# Patient Record
Sex: Female | Born: 1980 | Race: White | Hispanic: No | Marital: Married | Smoking: Never smoker
Health system: Southern US, Community
[De-identification: ages and names within clinical notes are randomized; demographics above are authoritative.]

## PROBLEM LIST (undated history)

## (undated) DIAGNOSIS — I10 Essential (primary) hypertension: Secondary | ICD-10-CM

## (undated) HISTORY — PX: HERNIA REPAIR: SHX51

## (undated) HISTORY — PX: TUBAL LIGATION: SHX77

## (undated) HISTORY — DX: Essential (primary) hypertension: I10

## (undated) HISTORY — PX: CHOLECYSTECTOMY: SHX55

---

## 2016-07-22 ENCOUNTER — Encounter: Payer: Self-pay | Admitting: Emergency Medicine

## 2016-07-22 ENCOUNTER — Emergency Department: Payer: Self-pay

## 2016-07-22 DIAGNOSIS — I8002 Phlebitis and thrombophlebitis of superficial vessels of left lower extremity: Secondary | ICD-10-CM | POA: Insufficient documentation

## 2016-07-22 DIAGNOSIS — I83892 Varicose veins of left lower extremities with other complications: Secondary | ICD-10-CM | POA: Insufficient documentation

## 2016-07-22 NOTE — ED Triage Notes (Signed)
Patient ambulatory to triage with steady gait, without difficulty or distress noted; pt reports bruising to upper right LE; denies any known injury; st swelling & pain

## 2016-07-23 ENCOUNTER — Emergency Department
Admission: EM | Admit: 2016-07-23 | Discharge: 2016-07-23 | Disposition: A | Discharge status: Home / Self Care | Payer: Self-pay | Attending: Emergency Medicine | Admitting: Emergency Medicine

## 2016-07-23 DIAGNOSIS — I8002 Phlebitis and thrombophlebitis of superficial vessels of left lower extremity: Secondary | ICD-10-CM

## 2016-07-23 DIAGNOSIS — I8392 Asymptomatic varicose veins of left lower extremity: Secondary | ICD-10-CM

## 2016-07-23 MED ORDER — OXYCODONE-ACETAMINOPHEN 5-325 MG PO TABS: 1.0000 | ORAL_TABLET | ORAL | 0 refills | Status: DC | PRN

## 2016-07-23 MED ORDER — OXYCODONE-ACETAMINOPHEN 5-325 MG PO TABS
1.0000 | ORAL_TABLET | Freq: Once | ORAL | Status: AC
  Administered 2016-07-23: 1 via ORAL
  Filled 2016-07-23: qty 1

## 2016-07-23 MED ORDER — IBUPROFEN 800 MG PO TABS: 800.0000 mg | ORAL_TABLET | Freq: Three times a day (TID) | ORAL | 0 refills | Status: AC | PRN

## 2016-07-23 MED ORDER — IBUPROFEN 800 MG PO TABS
800.0000 mg | ORAL_TABLET | Freq: Once | ORAL | Status: AC
  Administered 2016-07-23: 800 mg via ORAL
  Filled 2016-07-23: qty 1

## 2016-07-23 NOTE — ED Provider Notes (Signed)
[facility] Emergency Department Provider Note   ____________________________________________   First MD Initiated Contact with Patient 07/23/16 0038     (approximate)  I have reviewed the triage vital signs and the nursing notes.   HISTORY  Chief Complaint Leg Pain    HPI Heidi Morrow is a 36 y.o. female who presents to the ED from home with a chief complaint of left leg pain. Patient moved to town 2 weeks ago, 5 hour car ride. Developed redness and swelling to her left lower leg 4-5 days ago. 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 Denies associated fever, chills, chest pain, shortness of breath, abdominal pain, nausea, vomiting, diarrhea. Denies recent trauma. Nothing makes her symptoms better or worse.   Past medical history None  There are no active problems to display for this patient.   Past Surgical History:  Procedure Laterality Date  . CHOLECYSTECTOMY    . HERNIA REPAIR    . TUBAL LIGATION      Prior to Admission medications   Medication Sig Start Date End Date Taking? Authorizing Provider  lisinopril (PRINIVIL,ZESTRIL) 20 MG tablet Take 20 mg by mouth daily.   Yes Historical Provider, MD  ibuprofen (ADVIL,MOTRIN) 800 MG tablet Take 1 tablet (800 mg total) by mouth every 8 (eight) hours as needed for moderate pain. 07/23/16   Irean Hong, MD  oxyCODONE-acetaminophen (ROXICET) 5-325 MG tablet Take 1 tablet by mouth every 4 (four) hours as needed for severe pain. 07/23/16   Irean Hong, MD    Allergies Patient has no known allergies.  No family history on file.  Social History Social History  Substance Use Topics  . Smoking status: Never Smoker  . Smokeless tobacco: Never Used  . Alcohol use No    Review of Systems  Constitutional: No fever/chills. Eyes: No visual changes. ENT: No sore throat. Cardiovascular: Denies chest pain. Respiratory: Denies shortness of breath. Gastrointestinal: No  abdominal pain.  No nausea, no vomiting.  No diarrhea.  No constipation. Genitourinary: Negative for dysuria. Musculoskeletal: Positive for left leg pain. Negative for back pain. Skin: Negative for rash. Neurological: Negative for headaches, focal weakness or numbness.  10-point ROS otherwise negative.  ____________________________________________   PHYSICAL EXAM:  VITAL SIGNS: ED Triage Vitals  Enc Vitals Group     BP 07/22/16 2233 (!) 150/68     Pulse Rate 07/22/16 2233 97     Resp 07/22/16 2233 18     Temp 07/22/16 2233 97.9 F (36.6 C)     Temp Source 07/22/16 2233 Oral     SpO2 07/22/16 2233 99 %     Weight 07/22/16 2232 270 lb (122.5 kg)     Height 07/22/16 2232 5\' 7"  (1.702 m)     Head Circumference --      Peak Flow --      Pain Score 07/22/16 2232 3     Pain Loc --      Pain Edu? --      Excl. in GC? --     Constitutional: Alert and oriented. Well appearing and in no acute distress. Eyes: Conjunctivae are normal. PERRL. EOMI. Head: Atraumatic. Nose: No congestion/rhinnorhea. Mouth/Throat: Mucous membranes are moist.  Oropharynx non-erythematous. Neck: No stridor.   Cardiovascular: Normal rate, regular rhythm. Grossly normal heart sounds.  Good peripheral circulation. Respiratory: Normal respiratory effort.  No retractions. Lungs CTAB. Gastrointestinal: Soft and nontender. No distention. No abdominal bruits. No CVA tenderness.  Musculoskeletal:  LLE: Large, palm-sized area of erythema, warmth and swelling to anterior leg and medial calf. Calf is supple without evidence of compartment syndrome. Palpable distal pulses. Brisk, less than 5 second capillary refill. Symmetrically warm and without evidence for ischemia. Neurologic:  Normal speech and language. No gross focal neurologic deficits are appreciated.  Skin:  Skin is warm, dry and intact. No rash noted. Psychiatric: Mood and affect are normal. Speech and behavior are  normal.  ____________________________________________   LABS (all labs ordered are listed, but only abnormal results are displayed)  Labs Reviewed - No data to display ____________________________________________  EKG  None ____________________________________________  RADIOLOGY  Left leg venous ultrasound interpreted per Dr. Phill MyronMcClintock: 1. No evidence of deep venous thrombosis.  2. Multiple prominent thrombosed varicose veins at the medial and  lateral aspect of the calf at area of pain, swelling, and redness.   ____________________________________________   PROCEDURES  Procedure(s) performed: None  Procedures  Critical Care performed: No  ____________________________________________   INITIAL IMPRESSION / ASSESSMENT AND PLAN / ED COURSE  Pertinent labs & imaging results that were available during my care of the patient were reviewed by me and considered in my medical decision making (see chart for details).  36 year old female who presents with left leg pain, redness and swelling secondary to thrombosed varicose veins. Will treat with NSAID, analgesia and referred to vascular surgery for follow-up. Strict return precautions given. Patient verbalizes understanding and agrees with plan of care.      ____________________________________________   FINAL CLINICAL IMPRESSION(S) / ED DIAGNOSES  Final diagnoses:  Varicose veins of left lower extremity  Thrombophlebitis of superficial veins of left lower extremity      NEW MEDICATIONS STARTED DURING THIS VISIT:  New Prescriptions   IBUPROFEN (ADVIL,MOTRIN) 800 MG TABLET    Take 1 tablet (800 mg total) by mouth every 8 (eight) hours as needed for moderate pain.   OXYCODONE-ACETAMINOPHEN (ROXICET) 5-325 MG TABLET    Take 1 tablet by mouth every 4 (four) hours as needed for severe pain.     Note:  This document was prepared using Dragon voice recognition software and may include unintentional dictation  errors.    Irean HongJade J Shelitha Magley, MD 07/23/16 [phone]

## 2016-07-23 NOTE — Discharge Instructions (Signed)
1. Take pain medicines as needed (Motrin/Percocet #15). 2. Apply warm compresses several times daily. 3. Return to the ER for worsening symptoms, persistent vomiting, chest pain, difficulty breathing or other concerns.

## 2016-08-28 ENCOUNTER — Ambulatory Visit (INDEPENDENT_AMBULATORY_CARE_PROVIDER_SITE_OTHER): Payer: BLUE CROSS/BLUE SHIELD | Admitting: Vascular Surgery

## 2016-08-28 ENCOUNTER — Encounter (INDEPENDENT_AMBULATORY_CARE_PROVIDER_SITE_OTHER): Payer: Self-pay | Admitting: Vascular Surgery

## 2016-08-28 VITALS — BP 144/103 | HR 79 | Resp 16 | Ht 67.0 in | Wt 272.0 lb

## 2016-08-28 DIAGNOSIS — I8002 Phlebitis and thrombophlebitis of superficial vessels of left lower extremity: Secondary | ICD-10-CM | POA: Diagnosis not present

## 2016-08-28 DIAGNOSIS — R600 Localized edema: Secondary | ICD-10-CM

## 2016-08-28 DIAGNOSIS — I809 Phlebitis and thrombophlebitis of unspecified site: Secondary | ICD-10-CM | POA: Insufficient documentation

## 2016-08-28 NOTE — Progress Notes (Signed)
Subjective:    Patient ID: Heidi Morrow, female    DOB: 1980/06/22, 36 y.o.   MRN: [id number] Chief Complaint  Patient presents with  . New Patient (Initial Visit)   Patient presents after being seen and diagnosed with left lower extremity superficial thrombophlebitis (in [facility] ED) on 07/23/16. Patient recently moved from [place] and has been doing a lot of driving lately. She was placed on ASA and encouraged to wear compression stockings and elevate her legs. Her symptoms have improved. Since then, she has been wearing medical grade one compression, elevating her legs and remaining active. She does have a history of bilateral lower extremity swelling. Her job requires her to stand for long hours. She reports her swelling is worse at the end of the day. She denies a history of cellulitis. She does not have history of DVT however her father is prone to them. She denies any fever, nausea or vomitPatient presents after being seen and diagnosed with left lower extremity superficial thrombophlebitis (in [facility] ED) on 07/23/16. Patient ing.    Review of Systems  Constitutional: Negative.   HENT: Negative.   Eyes: Negative.   Respiratory: Negative.   Cardiovascular: Positive for leg swelling.  Gastrointestinal: Negative.   Endocrine: Negative.   Genitourinary: Negative.   Musculoskeletal: Negative.   Skin: Negative.   Allergic/Immunologic: Negative.   Neurological: Negative.   Hematological: Negative.   Psychiatric/Behavioral: Negative.        Objective:   Physical Exam  Constitutional: She is oriented to person, place, and time. She appears well-developed and well-nourished. No distress.  HENT:  Head: Normocephalic and atraumatic.  Eyes: Conjunctivae are normal. Pupils are equal, round, and reactive to light.  Neck: Normal range of motion.  Cardiovascular: Normal rate, regular rhythm, normal heart sounds and intact distal pulses.   Pulses:      Radial pulses are 2+ on the  right side, and 2+ on the left side.       Dorsalis pedis pulses are 2+ on the right side, and 2+ on the left side.       Posterior tibial pulses are 2+ on the right side, and 2+ on the left side.  Pulmonary/Chest: Effort normal.  Musculoskeletal: Normal range of motion. She exhibits edema (Moderate Bilateral Edema).  Neurological: She is alert and oriented to person, place, and time.  Skin: Skin is warm and dry. She is not diaphoretic.  Psychiatric: She has a normal mood and affect. Her behavior is normal. Judgment and thought content normal.  Vitals reviewed.  BP (!) 144/103   Pulse 79   Resp 16   Ht 5\' 7"  (1.702 m)   Wt 272 lb (123.4 kg)   BMI 42.60 kg/m   Past Medical History:  Diagnosis Date  . Hypertension    Social History   Social History  . Marital status: Married    Spouse name: N/A  . Number of children: N/A  . Years of education: N/A   Occupational History  . Not on file.   Social History Main Topics  . Smoking status: Never Smoker  . Smokeless tobacco: Never Used  . Alcohol use No  . Drug use: No  . Sexual activity: Not on file   Other Topics Concern  . Not on file   Social History Narrative  . No narrative on file   Past Surgical History:  Procedure Laterality Date  . CHOLECYSTECTOMY    . HERNIA REPAIR    . TUBAL LIGATION  Family History  Problem Relation Age of Onset  . Ovarian cancer Mother   . Hypertension Mother   . Heart disease Father   . Deep vein thrombosis Father   . Cancer Maternal Grandmother   . Diabetes Maternal Grandfather   . Heart attack Maternal Grandfather   . Hyperlipidemia Paternal Grandmother   . Deep vein thrombosis Paternal Grandmother   . Hypertension Paternal Grandmother   . Varicose Veins Paternal Grandmother   . Hyperlipidemia Paternal Grandfather   . Hypertension Paternal Grandfather    No Known Allergies     Assessment & Plan:  Patient presents after being seen and diagnosed with left lower extremity  superficial thrombophlebitis (in [facility] ED) on 07/23/16. Patient recently moved from [place] and has been doing a lot of driving lately. She was placed on ASA and encouraged to wear compression stockings and elevate her legs. Her symptoms have improved. Since then, she has been wearing medical grade one compression, elevating her legs and remaining active. She does have a history of bilateral lower extremity swelling. Her job requires her to stand for long hours. She reports her swelling is worse at the end of the day. She denies a history of cellulitis. She does not have history of DVT however her father is prone to them. She denies any fever, nausea or vomiting.   1. Bilateral lower extremity edema - New Patient with episode on left lower extremity superificial thrombophlebitis - this is resolving Patient with persistent swelling of lower extremity. Will bring patient back for venous duplex to rule out reflux. Continue conservative therapy for now.  - VAS US LOWER EXTREMITY VENOUS REFLUX; Future  2. Thrombophlebitis of superficial veins of left lower extremity - Resolving Patient with resolving thrombophlebitis of left lower extremity As above.  Current Outpatient Prescriptions on File Prior to Visit  Medication Sig Dispense Refill  . lisinopril (PRINIVIL,ZESTRIL) 20 MG tablet Take 20 mg by mouth daily.    Marland Kitchen. ibuprofen (ADVIL,MOTRIN) 800 MG tablet Take 1 tablet (800 mg total) by mouth every 8 (eight) hours as needed for moderate pain. (Patient not taking: Reported on 08/28/2016) 15 tablet 0  . oxyCODONE-acetaminophen (ROXICET) 5-325 MG tablet Take 1 tablet by mouth every 4 (four) hours as needed for severe pain. (Patient not taking: Reported on 08/28/2016) 15 tablet 0   No current facility-administered medications on file prior to visit.     There are no Patient Instructions on file for this visit. No Follow-up on file.   KIMBERLY A STEGMAYER, PA-C

## 2016-11-09 ENCOUNTER — Ambulatory Visit (INDEPENDENT_AMBULATORY_CARE_PROVIDER_SITE_OTHER): Payer: BLUE CROSS/BLUE SHIELD | Admitting: Vascular Surgery

## 2016-11-09 ENCOUNTER — Encounter (INDEPENDENT_AMBULATORY_CARE_PROVIDER_SITE_OTHER): Payer: BLUE CROSS/BLUE SHIELD

## 2017-03-01 ENCOUNTER — Encounter: Payer: Self-pay | Admitting: Emergency Medicine

## 2017-03-01 ENCOUNTER — Emergency Department
Admission: EM | Admit: 2017-03-01 | Discharge: 2017-03-01 | Disposition: A | Discharge status: Home / Self Care | Payer: BLUE CROSS/BLUE SHIELD | Attending: Emergency Medicine | Admitting: Emergency Medicine

## 2017-03-01 ENCOUNTER — Other Ambulatory Visit: Payer: Self-pay

## 2017-03-01 DIAGNOSIS — Y990 Civilian activity done for income or pay: Secondary | ICD-10-CM | POA: Insufficient documentation

## 2017-03-01 DIAGNOSIS — I10 Essential (primary) hypertension: Secondary | ICD-10-CM | POA: Insufficient documentation

## 2017-03-01 DIAGNOSIS — Y93G1 Activity, food preparation and clean up: Secondary | ICD-10-CM | POA: Diagnosis not present

## 2017-03-01 DIAGNOSIS — S61011A Laceration without foreign body of right thumb without damage to nail, initial encounter: Secondary | ICD-10-CM | POA: Insufficient documentation

## 2017-03-01 DIAGNOSIS — Z79899 Other long term (current) drug therapy: Secondary | ICD-10-CM | POA: Insufficient documentation

## 2017-03-01 DIAGNOSIS — W260XXA Contact with knife, initial encounter: Secondary | ICD-10-CM | POA: Diagnosis not present

## 2017-03-01 DIAGNOSIS — Y929 Unspecified place or not applicable: Secondary | ICD-10-CM | POA: Insufficient documentation

## 2017-03-01 MED ORDER — OXYCODONE-ACETAMINOPHEN 5-325 MG PO TABS
1.0000 | ORAL_TABLET | Freq: Once | ORAL | Status: AC
  Administered 2017-03-01: 1 via ORAL
  Filled 2017-03-01: qty 1

## 2017-03-01 MED ORDER — CEPHALEXIN 500 MG PO CAPS: 500.0000 mg | ORAL_CAPSULE | Freq: Four times a day (QID) | ORAL | 0 refills | Status: AC

## 2017-03-01 MED ORDER — TETANUS-DIPHTH-ACELL PERTUSSIS 5-2.5-18.5 LF-MCG/0.5 IM SUSP
0.5000 mL | Freq: Once | INTRAMUSCULAR | Status: AC
  Administered 2017-03-01: 0.5 mL via INTRAMUSCULAR
  Filled 2017-03-01: qty 0.5

## 2017-03-01 NOTE — ED Triage Notes (Signed)
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 Denies WC. Bleeding controlled with b andage.

## 2017-03-01 NOTE — ED Provider Notes (Signed)
[facility] Emergency Department Provider Note  ____________________________________________  Time seen: Approximately 1:33 PM  I have reviewed the triage vital signs and the nursing notes.   HISTORY  Chief Complaint Extremity Laceration    HPI Heidi Morrow is a 36 y.o. female that presents to the emergency department for evaluation of right hand laceration.  Patient was working today when she cut herself with a mandolin. She is not having any difficulty moving thumb.  Pain is throbbing in character.  She is unsure of last tetanus shot.  No numbness, tingling.  Past Medical History:  Diagnosis Date  . Hypertension     Patient Active Problem List   Diagnosis Date Noted  . Superficial thrombophlebitis 08/28/2016  . Bilateral lower extremity edema 08/28/2016    Past Surgical History:  Procedure Laterality Date  . CHOLECYSTECTOMY    . HERNIA REPAIR    . TUBAL LIGATION      Prior to Admission medications   Medication Sig Start Date End Date Taking? Authorizing Provider  cephALEXin (KEFLEX) 500 MG capsule Take 1 capsule (500 mg total) 4 (four) times daily for 10 days by mouth. 03/01/17 03/11/17  Enid Derry, PA-C  ibuprofen (ADVIL,MOTRIN) 800 MG tablet Take 1 tablet (800 mg total) by mouth every 8 (eight) hours as needed for moderate pain. Patient not taking: Reported on 08/28/2016 07/23/16   Irean Hong, MD  lisinopril (PRINIVIL,ZESTRIL) 20 MG tablet Take 20 mg by mouth daily.    [provider]  oxyCODONE-acetaminophen (ROXICET) 5-325 MG tablet Take 1 tablet by mouth every 4 (four) hours as needed for severe pain. Patient not taking: Reported on 08/28/2016 07/23/16   Irean Hong, MD    Allergies Patient has no known allergies.  Family History  Problem Relation Age of Onset  . Ovarian cancer Mother   . Hypertension Mother   . Heart disease Father   . Deep vein thrombosis Father   . Cancer Maternal Grandmother   . Diabetes  Maternal Grandfather   . Heart attack Maternal Grandfather   . Hyperlipidemia Paternal Grandmother   . Deep vein thrombosis Paternal Grandmother   . Hypertension Paternal Grandmother   . Varicose Veins Paternal Grandmother   . Hyperlipidemia Paternal Grandfather   . Hypertension Paternal Grandfather     Social History Social History   Tobacco Use  . Smoking status: Never Smoker  . Smokeless tobacco: Never Used  Substance Use Topics  . Alcohol use: No  . Drug use: No     Review of Systems  Constitutional: No fever/chills Cardiovascular: No chest pain. Respiratory: No SOB. Gastrointestinal: No abdominal pain.  No nausea, no vomiting.  Musculoskeletal: Positive for hand pain. Skin: Negative for ecchymosis. Neurological: Negative for headaches, numbness or tingling   ____________________________________________   PHYSICAL EXAM:  VITAL SIGNS: ED Triage Vitals  Enc Vitals Group     BP 03/01/17 1233 (!) 140/104     Pulse Rate 03/01/17 1233 91     Resp 03/01/17 1233 18     Temp 03/01/17 1233 97.9 F (36.6 C)     Temp Source 03/01/17 1233 Oral     SpO2 03/01/17 1233 96 %     Weight 03/01/17 1233 260 lb (117.9 kg)     Height 03/01/17 1233 5\' 7"  (1.702 m)     Head Circumference --      Peak Flow --      Pain Score 03/01/17 1232 10     Pain Loc --  Pain Edu? --      Excl. in GC? --      Constitutional: Alert and oriented. Well appearing and in no acute distress. Eyes: Conjunctivae are normal. PERRL. EOMI. Head: Atraumatic. ENT:      Ears:      Nose: No congestion/rhinnorhea.      Mouth/Throat: Mucous membranes are moist.  Neck: No stridor.   Cardiovascular: Normal rate, regular rhythm.  Good peripheral circulation.  Symmetric radial pulses bilaterally. Respiratory: Normal respiratory effort without tachypnea or retractions. Lungs CTAB. Good air entry to the bases with no decreased or absent breath sounds. Musculoskeletal: Full range of motion to all  extremities. No gross deformities appreciated. Neurologic:  Normal speech and language. No gross focal neurologic deficits are appreciated.  Skin:  Skin is warm, dry.  2 1 cm shallow lacerations to base of right thumb. Psychiatric: Mood and affect are normal. Speech and behavior are normal. Patient exhibits appropriate insight and judgement.   ____________________________________________   LABS (all labs ordered are listed, but only abnormal results are displayed)  Labs Reviewed - No data to display ____________________________________________  EKG   ____________________________________________  RADIOLOGY   No results found.  ____________________________________________    PROCEDURES  Procedure(s) performed:    Procedures  Lacerations were soaked in normal saline and iodine.  Dermabond and Steri-Strips were applied to lacerations.  Splint was placed.  Medications  Tdap (BOOSTRIX) injection 0.5 mL (0.5 mLs Intramuscular Given 03/01/17 1347)  oxyCODONE-acetaminophen (PERCOCET/ROXICET) 5-325 MG per tablet 1 tablet (1 tablet Oral Given 03/01/17 1346)     ____________________________________________   INITIAL IMPRESSION / ASSESSMENT AND PLAN / ED COURSE  Pertinent labs & imaging results that were available during my care of the patient were reviewed by me and considered in my medical decision making (see chart for details).  Review of [place] CSRS was performed in accordance of the [facility] prior to dispensing any controlled drugs.   Patient presented to the emergency department for evaluation of right hand lacerations.  Vital signs and exam are reassuring.  Lacerations are shallow and were repaired with Dermabond and Steri-Strips.  Thumb spica splint was placed so that patient does not reopen lacerations.  Tetanus shot was updated.  Patient will be discharged home with prescriptions for Keflex.  Work note was provided.  Patient is to follow up with PCP as directed. Patient  is given ED precautions to return to the ED for any worsening or new symptoms.     ____________________________________________  FINAL CLINICAL IMPRESSION(S) / ED DIAGNOSES  Final diagnoses:  Laceration of right thumb without foreign body without damage to nail, initial encounter      NEW MEDICATIONS STARTED DURING THIS VISIT:  Started on Keflex.    This chart was dictated using voice recognition software/Dragon. Despite best efforts to proofread, errors can occur which can change the meaning. Any change was purely unintentional.    Enid DerryWagner, Alondra Sahni, PA-C 03/01/17 1552    Don PerkingVeronese, [place], MD 03/01/17 [phone]

## 2017-08-20 ENCOUNTER — Encounter: Payer: Self-pay | Admitting: *Deleted

## 2017-08-20 ENCOUNTER — Ambulatory Visit (INDEPENDENT_AMBULATORY_CARE_PROVIDER_SITE_OTHER): Payer: BLUE CROSS/BLUE SHIELD

## 2017-08-20 ENCOUNTER — Ambulatory Visit
Admission: EM | Admit: 2017-08-20 | Discharge: 2017-08-20 | Disposition: A | Discharge status: Home / Self Care | Payer: BLUE CROSS/BLUE SHIELD | Attending: Family Medicine | Admitting: Family Medicine

## 2017-08-20 DIAGNOSIS — R059 Cough, unspecified: Secondary | ICD-10-CM

## 2017-08-20 DIAGNOSIS — R05 Cough: Secondary | ICD-10-CM

## 2017-08-20 DIAGNOSIS — R062 Wheezing: Secondary | ICD-10-CM

## 2017-08-20 MED ORDER — HYDROCOD POLST-CPM POLST ER 10-8 MG/5ML PO SUER: 5.0000 mL | Freq: Two times a day (BID) | ORAL | 0 refills | Status: DC | PRN

## 2017-08-20 MED ORDER — PREDNISONE 50 MG PO TABS: ORAL_TABLET | ORAL | 0 refills | Status: DC

## 2017-08-20 MED ORDER — ALBUTEROL SULFATE HFA 108 (90 BASE) MCG/ACT IN AERS: 1.0000 | INHALATION_SPRAY | Freq: Four times a day (QID) | RESPIRATORY_TRACT | 0 refills | Status: AC | PRN

## 2017-08-20 NOTE — ED Triage Notes (Signed)
Pt was diagnose with walking PNA three weeks ago and was given antibiotics, cough syrup, and cough pills. Pt states she felt better for about three days after taking meds, but now she has a non productive cough that will not stop.

## 2017-08-20 NOTE — ED Provider Notes (Signed)
[facility]    CSN: [id number] Arrival date & time: 08/20/17  1248  History   Chief Complaint Chief Complaint  Patient presents with  . Cough   HPI  37 year old female presents with cough.  Patient states that she has had ongoing cough.  She was recently seen by her primary on 3/12.  She was placed on antibiotic therapy for sinusitis.  She was also given cough medication.  She states that she traveled to [place] and remained sick.  She was given another course of antibiotic, which she states was penicillin.  Patient states that she improved but still has persistent cough.  She states that she is experiencing cough and shortness of breath.  No fever.  Seems to be exacerbated by her workplace.  No relieving factors.  No recent fever or chills.  No other associated symptoms.  No other complaints.  Past Medical History:  Diagnosis Date  . Hypertension    Patient Active Problem List   Diagnosis Date Noted  . Superficial thrombophlebitis 08/28/2016  . Bilateral lower extremity edema 08/28/2016   Past Surgical History:  Procedure Laterality Date  . CHOLECYSTECTOMY    . HERNIA REPAIR    . TUBAL LIGATION     OB History   None    Home Medications    Prior to Admission medications   Medication Sig Start Date End Date Taking? Authorizing Provider  escitalopram (LEXAPRO) 10 MG tablet Take 10 mg by mouth daily.   Yes [provider]  lisinopril (PRINIVIL,ZESTRIL) 20 MG tablet Take 20 mg by mouth daily.   Yes [provider]  albuterol (PROVENTIL HFA;VENTOLIN HFA) 108 (90 Base) MCG/ACT inhaler Inhale 1-2 puffs into the lungs every 6 (six) hours as needed for wheezing or shortness of breath. 08/20/17   Tommie Sams, DO  chlorpheniramine-HYDROcodone (TUSSIONEX PENNKINETIC ER) 10-8 MG/5ML SUER Take 5 mLs by mouth every 12 (twelve) hours as needed. 08/20/17   Tommie Sams, DO  ibuprofen (ADVIL,MOTRIN) 800 MG tablet Take 1 tablet (800 mg total) by mouth every 8  (eight) hours as needed for moderate pain. Patient not taking: Reported on 08/28/2016 07/23/16   Irean Hong, MD  predniSONE (DELTASONE) 50 MG tablet 1 tablet daily x 5 days. 08/20/17   Tommie Sams, DO    Family History Family History  Problem Relation Age of Onset  . Ovarian cancer Mother   . Hypertension Mother   . Heart disease Father   . Deep vein thrombosis Father   . Cancer Maternal Grandmother   . Diabetes Maternal Grandfather   . Heart attack Maternal Grandfather   . Hyperlipidemia Paternal Grandmother   . Deep vein thrombosis Paternal Grandmother   . Hypertension Paternal Grandmother   . Varicose Veins Paternal Grandmother   . Hyperlipidemia Paternal Grandfather   . Hypertension Paternal Grandfather     Social History Social History   Tobacco Use  . Smoking status: Never Smoker  . Smokeless tobacco: Never Used  Substance Use Topics  . Alcohol use: No  . Drug use: No     Allergies   Patient has no known allergies.   Review of Systems Review of Systems  Constitutional: Negative.   Respiratory: Positive for cough and shortness of breath.     Physical Exam Triage Vital Signs ED Triage Vitals  Enc Vitals Group     BP 08/20/17 1306 (!) 152/84     Pulse Rate 08/20/17 1306 (!) 119     Resp 08/20/17  1306 16     Temp 08/20/17 1306 98.4 F (36.9 C)     Temp src --      SpO2 08/20/17 1306 94 %     Weight 08/20/17 1305 253 lb (114.8 kg)     Height 08/20/17 1305 5\' 7"  (1.702 m)     Head Circumference --      Peak Flow --      Pain Score 08/20/17 1402 0     Pain Loc --      Pain Edu? --      Excl. in GC? --    Updated Vital Signs BP (!) 152/84   Pulse (!) 119   Temp 98.4 F (36.9 C)   Resp 16   Ht 5\' 7"  (1.702 m)   Wt 253 lb (114.8 kg)   LMP 08/12/2017   SpO2 94%   BMI 39.63 kg/m      Physical Exam  Constitutional: She is oriented to person, place, and time. She appears well-developed. No distress.  HENT:  Head: Normocephalic and atraumatic.    Mouth/Throat: Oropharynx is clear and moist.  Cardiovascular: Regular rhythm.  Tachycardia.  Pulmonary/Chest: Effort normal. No respiratory distress. She has wheezes.  Neurological: She is alert and oriented to person, place, and time.  Psychiatric: She has a normal mood and affect. Her behavior is normal.  Nursing note and vitals reviewed.  UC Treatments / Results  Labs (all labs ordered are listed, but only abnormal results are displayed) Labs Reviewed - No data to display  EKG None Radiology Dg Chest 2 View  Result Date: 08/20/2017 CLINICAL DATA:  Dry painful cough and shortness of breath for 6 weeks EXAM: CHEST - 2 VIEW COMPARISON:  None FINDINGS: Normal heart size, mediastinal contours, and pulmonary vascularity. Lungs clear. No pleural effusion or pneumothorax. Bones unremarkable. IMPRESSION: No acute abnormalities. Electronically Signed   By: Ulyses SouthwardMark  Boles M.D.   On: 08/20/2017 13:52    Procedures Procedures (including critical care time)  Medications Ordered in UC Medications - No data to display   Initial Impression / Assessment and Plan / UC Course  I have reviewed the triage vital signs and the nursing notes.  Pertinent labs & imaging results that were available during my care of the patient were reviewed by me and considered in my medical decision making (see chart for details).    37 year old female presents with cough and wheezing.  Xray negative. She has recently had antibiotic therapy.  No indication for additional antibiotics.  Treating with prednisone, albuterol, tussionex.  Final Clinical Impressions(s) / UC Diagnoses   Final diagnoses:  Cough  Wheezing    ED Discharge Orders        Ordered    predniSONE (DELTASONE) 50 MG tablet     08/20/17 1400    chlorpheniramine-HYDROcodone (TUSSIONEX PENNKINETIC ER) 10-8 MG/5ML SUER  Every 12 hours PRN     08/20/17 1400    albuterol (PROVENTIL HFA;VENTOLIN HFA) 108 (90 Base) MCG/ACT inhaler  Every 6 hours PRN      08/20/17 1403     Controlled Substance Prescriptions [place] Controlled Substance Registry consulted? Yes, I have consulted the [place] Controlled Substances Registry for this patient.  She recently had cough medication but no other narcotics.  Reasonable to proceed with cough medication.   Tommie SamsCook, Xiomar Crompton G, [place] 08/20/17 1423

## 2017-08-20 NOTE — Discharge Instructions (Signed)
Medications as prescribed. ° °Take care ° °Dr. Ezekiel Menzer  °

## 2017-09-13 ENCOUNTER — Other Ambulatory Visit: Payer: Self-pay | Admitting: Family Medicine

## 2018-02-28 ENCOUNTER — Other Ambulatory Visit: Payer: Self-pay

## 2018-02-28 ENCOUNTER — Encounter: Payer: Self-pay | Admitting: Emergency Medicine

## 2018-02-28 ENCOUNTER — Emergency Department
Admission: EM | Admit: 2018-02-28 | Discharge: 2018-02-28 | Disposition: A | Discharge status: Home / Self Care | Payer: BLUE CROSS/BLUE SHIELD | Attending: Emergency Medicine | Admitting: Emergency Medicine

## 2018-02-28 ENCOUNTER — Emergency Department: Payer: BLUE CROSS/BLUE SHIELD

## 2018-02-28 DIAGNOSIS — Y939 Activity, unspecified: Secondary | ICD-10-CM | POA: Diagnosis not present

## 2018-02-28 DIAGNOSIS — Y92410 Unspecified street and highway as the place of occurrence of the external cause: Secondary | ICD-10-CM | POA: Insufficient documentation

## 2018-02-28 DIAGNOSIS — Z79899 Other long term (current) drug therapy: Secondary | ICD-10-CM | POA: Diagnosis not present

## 2018-02-28 DIAGNOSIS — Y998 Other external cause status: Secondary | ICD-10-CM | POA: Diagnosis not present

## 2018-02-28 DIAGNOSIS — I10 Essential (primary) hypertension: Secondary | ICD-10-CM | POA: Insufficient documentation

## 2018-02-28 DIAGNOSIS — M79605 Pain in left leg: Secondary | ICD-10-CM | POA: Insufficient documentation

## 2018-02-28 MED ORDER — OXYCODONE-ACETAMINOPHEN 5-325 MG PO TABS
1.0000 | ORAL_TABLET | Freq: Once | ORAL | Status: AC
  Administered 2018-02-28: 1 via ORAL
  Filled 2018-02-28: qty 1

## 2018-02-28 MED ORDER — NAPROXEN 375 MG PO TABS: 375.0000 mg | ORAL_TABLET | Freq: Two times a day (BID) | ORAL | 0 refills | Status: DC

## 2018-02-28 MED ORDER — CYCLOBENZAPRINE HCL 5 MG PO TABS: ORAL_TABLET | ORAL | 0 refills | Status: DC
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
## 2018-04-28 ENCOUNTER — Ambulatory Visit
Admission: EM | Admit: 2018-04-28 | Discharge: 2018-04-28 | Disposition: A | Discharge status: Home / Self Care | Payer: BLUE CROSS/BLUE SHIELD | Attending: Family Medicine | Admitting: Family Medicine

## 2018-04-28 ENCOUNTER — Ambulatory Visit (INDEPENDENT_AMBULATORY_CARE_PROVIDER_SITE_OTHER): Payer: BLUE CROSS/BLUE SHIELD

## 2018-04-28 DIAGNOSIS — J40 Bronchitis, not specified as acute or chronic: Secondary | ICD-10-CM

## 2018-04-28 DIAGNOSIS — R05 Cough: Secondary | ICD-10-CM

## 2018-04-28 LAB — RAPID INFLUENZA A&B ANTIGENS
Influenza A (ARMC): NEGATIVE
Influenza B (ARMC): NEGATIVE

## 2018-04-28 MED ORDER — PREDNISONE 50 MG PO TABS: ORAL_TABLET | ORAL | 0 refills | Status: AC

## 2018-04-28 MED ORDER — HYDROCOD POLST-CPM POLST ER 10-8 MG/5ML PO SUER: 5.0000 mL | Freq: Every evening | ORAL | 0 refills | Status: AC | PRN

## 2018-04-28 MED ORDER — IPRATROPIUM-ALBUTEROL 0.5-2.5 (3) MG/3ML IN SOLN
3.0000 mL | Freq: Once | RESPIRATORY_TRACT | Status: AC
  Administered 2018-04-28: 3 mL via RESPIRATORY_TRACT

## 2018-04-28 NOTE — ED Provider Notes (Signed)
[facility]    CSN: [id number] Arrival date & time: 04/28/18  0825  History   Chief Complaint Chief Complaint  Patient presents with  . Cough   HPI  38 year old female presents with cough.  Patient states that she has had a cough for nearly 2 months.  She has seen her primary care physician for this 3 times.  Most recently on 12/27.  She has been prescribed Omnicef, Levaquin, albuterol, Tessalon Perles without resolution.  No chest x-ray has been performed.  Patient reports that she continues to have cough.  She is now having more shortness of breath.  Was worse as of yesterday.  She developed a fever last night of 101.  She has taken ibuprofen with improvement in her fever.  No known exacerbating factors.  Patient does not smoke.  She states that she is not around anyone who smokes.  No other reported symptoms.  No other complaints or concerns at this time.  PMH, Surgical Hx, Family Hx, Social History reviewed and updated as below.  Past Medical History:  Diagnosis Date  . Hypertension   Anxiety Morbid Obesity  Patient Active Problem List   Diagnosis Date Noted  . Superficial thrombophlebitis 08/28/2016  . Bilateral lower extremity edema 08/28/2016    Past Surgical History:  Procedure Laterality Date  . CHOLECYSTECTOMY    . HERNIA REPAIR    . TUBAL LIGATION      OB History   No obstetric history on file.      Home Medications    Prior to Admission medications   Medication Sig Start Date End Date Taking? Authorizing Provider  albuterol (PROVENTIL HFA;VENTOLIN HFA) 108 (90 Base) MCG/ACT inhaler Inhale 1-2 puffs into the lungs every 6 (six) hours as needed for wheezing or shortness of breath. 08/20/17   Tommie Samsook, Mateen Franssen G, DO  chlorpheniramine-HYDROcodone (TUSSIONEX PENNKINETIC ER) 10-8 MG/5ML SUER Take 5 mLs by mouth at bedtime as needed. 04/28/18   Tommie Samsook, Breslin Hemann G, DO  escitalopram (LEXAPRO) 10 MG tablet Take 10 mg by mouth daily.    [provider]    ibuprofen (ADVIL,MOTRIN) 800 MG tablet Take 1 tablet (800 mg total) by mouth every 8 (eight) hours as needed for moderate pain. Patient not taking: Reported on 08/28/2016 07/23/16   Irean HongSung, Jade J, MD  lisinopril (PRINIVIL,ZESTRIL) 20 MG tablet Take 20 mg by mouth daily.    [provider]  predniSONE (DELTASONE) 50 MG tablet 1 tablet daily x 5 days 04/28/18   Tommie Samsook, Goddess Gebbia G, DO    Family History Family History  Problem Relation Age of Onset  . Ovarian cancer Mother   . Hypertension Mother   . Heart disease Father   . Deep vein thrombosis Father   . Cancer Maternal Grandmother   . Diabetes Maternal Grandfather   . Heart attack Maternal Grandfather   . Hyperlipidemia Paternal Grandmother   . Deep vein thrombosis Paternal Grandmother   . Hypertension Paternal Grandmother   . Varicose Veins Paternal Grandmother   . Hyperlipidemia Paternal Grandfather   . Hypertension Paternal Grandfather     Social History Social History   Tobacco Use  . Smoking status: Never Smoker  . Smokeless tobacco: Never Used  Substance Use Topics  . Alcohol use: No  . Drug use: No     Allergies   Patient has no known allergies.   Review of Systems Review of Systems  Constitutional: Positive for fever.  HENT: Positive for sore throat.   Respiratory: Positive for  cough and shortness of breath.    Physical Exam Triage Vital Signs ED Triage Vitals  Enc Vitals Group     BP 04/28/18 0837 (!) 156/104     Pulse Rate 04/28/18 0837 92     Resp 04/28/18 0837 18     Temp 04/28/18 0837 98.1 F (36.7 C)     Temp src --      SpO2 04/28/18 0837 94 %     Weight 04/28/18 0838 280 lb (127 kg)     Height 04/28/18 0838 5\' 7"  (1.702 m)     Head Circumference --      Peak Flow --      Pain Score 04/28/18 0838 0     Pain Loc --      Pain Edu? --      Excl. in GC? --    Updated Vital Signs BP 132/89 (BP Location: Left Arm) Comment: large cuff   Pulse 93   Temp 98.1 F (36.7 C)   Resp 18   Ht 5'  7" (1.702 m)   Wt 127 kg   LMP 04/18/2018   SpO2 94%   BMI 43.85 kg/m   Visual Acuity Right Eye Distance:   Left Eye Distance:   Bilateral Distance:    Right Eye Near:   Left Eye Near:    Bilateral Near:     Physical Exam Vitals signs and nursing note reviewed.  Constitutional:      General: She is not in acute distress. HENT:     Head: Normocephalic and atraumatic.     Right Ear: Tympanic membrane normal.     Left Ear: Tympanic membrane normal.     Mouth/Throat:     Comments: Mild oropharyngeal erythema.  Enlarged tonsils. Neck:     Musculoskeletal: Neck supple.  Cardiovascular:     Rate and Rhythm: Normal rate and regular rhythm.  Pulmonary:     Effort: Pulmonary effort is normal.     Comments: Diffuse expiratory wheezing. Lymphadenopathy:     Cervical: No cervical adenopathy.  Neurological:     Mental Status: She is alert.  Psychiatric:        Mood and Affect: Mood normal.        Behavior: Behavior normal.    UC Treatments / Results  Labs (all labs ordered are listed, but only abnormal results are displayed) Labs Reviewed  RAPID INFLUENZA A&B ANTIGENS ([facility] ONLY)    EKG None  Radiology Dg Chest 2 View  Result Date: 04/28/2018 CLINICAL DATA:  Patient with cough for 2 months EXAM: CHEST - 2 VIEW COMPARISON:  Chest radiograph 08/20/2017 FINDINGS: Stable cardiac and mediastinal contours. Elevation right hemidiaphragm. No large area pulmonary consolidation. No pleural effusion or pneumothorax. Regional skeleton is unremarkable. IMPRESSION: No acute cardiopulmonary process. Electronically Signed   By: Annia Belt M.D.   On: 04/28/2018 09:08    Procedures Procedures (including critical care time)  Medications Ordered in UC Medications  ipratropium-albuterol (DUONEB) 0.5-2.5 (3) MG/3ML nebulizer solution 3 mL (3 mLs Nebulization Given 04/28/18 0904)    Initial Impression / Assessment and Plan / UC Course  I have reviewed the triage vital signs and the  nursing notes.  Pertinent labs & imaging results that were available during my care of the patient were reviewed by me and considered in my medical decision making (see chart for details).    38 year old female presents with acute bronchitis.  There is no documented history of asthma.  Patient wheezing on exam.  Mild improvement with DuoNeb treatment.  Chest x-ray negative.  Flu negative.  Placing on prednisone and Tussionex.  Work note given.  Final Clinical Impressions(s) / UC Diagnoses   Final diagnoses:  Bronchitis     Discharge Instructions     Medication as prescribed.  Take care  Dr. Adriana Simas     ED Prescriptions    Medication Sig Dispense Auth. Provider   predniSONE (DELTASONE) 50 MG tablet 1 tablet daily x 5 days 5 tablet Alixandra Alfieri G, DO   chlorpheniramine-HYDROcodone (TUSSIONEX PENNKINETIC ER) 10-8 MG/5ML SUER Take 5 mLs by mouth at bedtime as needed. 60 mL Tommie Sams, DO     Controlled Substance Prescriptions [place] Controlled Substance Registry consulted? Not Applicable   Tommie Sams, [place] 04/28/18 [id number]

## 2018-04-28 NOTE — Discharge Instructions (Signed)
Medication as prescribed.  Take care  Dr. Emilygrace Grothe  

## 2018-04-28 NOTE — ED Triage Notes (Signed)
Pt reports recurring cough over last 2 months associated with SOB over last week. Pt reports seen pcp recently for same and prescribed inhaler and cough medication without relief in symptoms. Pt reports fever yesterday
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
